# Patient Record
Sex: Female | Born: 1935 | Race: White | Hispanic: No | Marital: Married | Smoking: Never smoker
Health system: Southern US, Community
[De-identification: ages and names within clinical notes are randomized; demographics above are authoritative.]

## PROBLEM LIST (undated history)

## (undated) DIAGNOSIS — R42 Dizziness and giddiness: Secondary | ICD-10-CM

## (undated) DIAGNOSIS — E785 Hyperlipidemia, unspecified: Secondary | ICD-10-CM

## (undated) HISTORY — DX: Hyperlipidemia, unspecified: E78.5

## (undated) HISTORY — DX: Dizziness and giddiness: R42

---

## 2001-04-19 ENCOUNTER — Encounter: Payer: Self-pay | Admitting: Family Medicine

## 2001-04-19 ENCOUNTER — Ambulatory Visit (HOSPITAL_COMMUNITY): Admission: RE | Admit: 2001-04-19 | Discharge: 2001-04-19 | Payer: Self-pay | Admitting: Family Medicine

## 2002-04-23 ENCOUNTER — Emergency Department (HOSPITAL_COMMUNITY): Admission: EM | Admit: 2002-04-23 | Discharge: 2002-04-23 | Payer: Self-pay | Admitting: Emergency Medicine

## 2002-04-23 ENCOUNTER — Encounter: Payer: Self-pay | Admitting: Emergency Medicine

## 2004-01-06 ENCOUNTER — Emergency Department (HOSPITAL_COMMUNITY): Admission: EM | Admit: 2004-01-06 | Discharge: 2004-01-06 | Payer: Self-pay | Admitting: Emergency Medicine

## 2005-12-16 ENCOUNTER — Encounter: Admission: RE | Admit: 2005-12-16 | Discharge: 2005-12-16 | Payer: Self-pay | Admitting: Otolaryngology

## 2010-08-30 ENCOUNTER — Other Ambulatory Visit: Payer: Self-pay | Admitting: Family Medicine

## 2010-08-30 DIAGNOSIS — Z1231 Encounter for screening mammogram for malignant neoplasm of breast: Secondary | ICD-10-CM

## 2010-09-07 ENCOUNTER — Ambulatory Visit: Payer: Self-pay

## 2010-09-30 ENCOUNTER — Ambulatory Visit
Admission: RE | Admit: 2010-09-30 | Discharge: 2010-09-30 | Disposition: A | Discharge status: Home / Self Care | Payer: Medicare Other | Source: Ambulatory Visit | Attending: Family Medicine | Admitting: Family Medicine

## 2010-09-30 DIAGNOSIS — Z1231 Encounter for screening mammogram for malignant neoplasm of breast: Secondary | ICD-10-CM

## 2011-05-10 DIAGNOSIS — E785 Hyperlipidemia, unspecified: Secondary | ICD-10-CM | POA: Diagnosis not present

## 2011-05-10 DIAGNOSIS — R059 Cough, unspecified: Secondary | ICD-10-CM | POA: Diagnosis not present

## 2011-05-10 DIAGNOSIS — R05 Cough: Secondary | ICD-10-CM | POA: Diagnosis not present

## 2011-05-10 DIAGNOSIS — R21 Rash and other nonspecific skin eruption: Secondary | ICD-10-CM | POA: Diagnosis not present

## 2012-03-06 DIAGNOSIS — L820 Inflamed seborrheic keratosis: Secondary | ICD-10-CM | POA: Diagnosis not present

## 2012-03-06 DIAGNOSIS — D1801 Hemangioma of skin and subcutaneous tissue: Secondary | ICD-10-CM | POA: Diagnosis not present

## 2012-03-06 DIAGNOSIS — L919 Hypertrophic disorder of the skin, unspecified: Secondary | ICD-10-CM | POA: Diagnosis not present

## 2012-03-06 DIAGNOSIS — D485 Neoplasm of uncertain behavior of skin: Secondary | ICD-10-CM | POA: Diagnosis not present

## 2012-03-06 DIAGNOSIS — L821 Other seborrheic keratosis: Secondary | ICD-10-CM | POA: Diagnosis not present

## 2012-03-06 DIAGNOSIS — L909 Atrophic disorder of skin, unspecified: Secondary | ICD-10-CM | POA: Diagnosis not present

## 2012-03-15 DIAGNOSIS — Z23 Encounter for immunization: Secondary | ICD-10-CM | POA: Diagnosis not present

## 2012-04-24 DIAGNOSIS — E7800 Pure hypercholesterolemia, unspecified: Secondary | ICD-10-CM | POA: Diagnosis not present

## 2012-04-24 DIAGNOSIS — K219 Gastro-esophageal reflux disease without esophagitis: Secondary | ICD-10-CM | POA: Diagnosis not present

## 2012-04-24 DIAGNOSIS — E039 Hypothyroidism, unspecified: Secondary | ICD-10-CM | POA: Diagnosis not present

## 2012-06-22 DIAGNOSIS — D1801 Hemangioma of skin and subcutaneous tissue: Secondary | ICD-10-CM | POA: Diagnosis not present

## 2012-06-22 DIAGNOSIS — L820 Inflamed seborrheic keratosis: Secondary | ICD-10-CM | POA: Diagnosis not present

## 2012-08-13 DIAGNOSIS — H04129 Dry eye syndrome of unspecified lacrimal gland: Secondary | ICD-10-CM | POA: Diagnosis not present

## 2012-08-13 DIAGNOSIS — H2640 Unspecified secondary cataract: Secondary | ICD-10-CM | POA: Diagnosis not present

## 2012-08-13 DIAGNOSIS — H43819 Vitreous degeneration, unspecified eye: Secondary | ICD-10-CM | POA: Diagnosis not present

## 2012-08-13 DIAGNOSIS — Z961 Presence of intraocular lens: Secondary | ICD-10-CM | POA: Diagnosis not present

## 2012-10-23 DIAGNOSIS — Z1331 Encounter for screening for depression: Secondary | ICD-10-CM | POA: Diagnosis not present

## 2012-10-23 DIAGNOSIS — K219 Gastro-esophageal reflux disease without esophagitis: Secondary | ICD-10-CM | POA: Diagnosis not present

## 2012-10-23 DIAGNOSIS — E7800 Pure hypercholesterolemia, unspecified: Secondary | ICD-10-CM | POA: Diagnosis not present

## 2012-10-23 DIAGNOSIS — E039 Hypothyroidism, unspecified: Secondary | ICD-10-CM | POA: Diagnosis not present

## 2012-10-23 DIAGNOSIS — Z0000 Encounter for general adult medical examination without abnormal findings: Secondary | ICD-10-CM | POA: Diagnosis not present

## 2012-10-23 DIAGNOSIS — Z1211 Encounter for screening for malignant neoplasm of colon: Secondary | ICD-10-CM | POA: Diagnosis not present

## 2012-10-23 DIAGNOSIS — Z23 Encounter for immunization: Secondary | ICD-10-CM | POA: Diagnosis not present

## 2013-03-05 DIAGNOSIS — L570 Actinic keratosis: Secondary | ICD-10-CM | POA: Diagnosis not present

## 2013-03-05 DIAGNOSIS — L723 Sebaceous cyst: Secondary | ICD-10-CM | POA: Diagnosis not present

## 2013-03-05 DIAGNOSIS — L820 Inflamed seborrheic keratosis: Secondary | ICD-10-CM | POA: Diagnosis not present

## 2013-03-05 DIAGNOSIS — L821 Other seborrheic keratosis: Secondary | ICD-10-CM | POA: Diagnosis not present

## 2013-04-17 DIAGNOSIS — E7800 Pure hypercholesterolemia, unspecified: Secondary | ICD-10-CM | POA: Diagnosis not present

## 2013-04-17 DIAGNOSIS — E039 Hypothyroidism, unspecified: Secondary | ICD-10-CM | POA: Diagnosis not present

## 2013-05-08 DIAGNOSIS — M899 Disorder of bone, unspecified: Secondary | ICD-10-CM | POA: Diagnosis not present

## 2013-05-08 DIAGNOSIS — Z780 Asymptomatic menopausal state: Secondary | ICD-10-CM | POA: Diagnosis not present

## 2013-05-08 DIAGNOSIS — M949 Disorder of cartilage, unspecified: Secondary | ICD-10-CM | POA: Diagnosis not present

## 2013-06-03 DIAGNOSIS — M899 Disorder of bone, unspecified: Secondary | ICD-10-CM | POA: Diagnosis not present

## 2013-06-03 DIAGNOSIS — M949 Disorder of cartilage, unspecified: Secondary | ICD-10-CM | POA: Diagnosis not present

## 2013-08-15 DIAGNOSIS — H6120 Impacted cerumen, unspecified ear: Secondary | ICD-10-CM | POA: Diagnosis not present

## 2013-08-21 DIAGNOSIS — Z961 Presence of intraocular lens: Secondary | ICD-10-CM | POA: Diagnosis not present

## 2013-08-21 DIAGNOSIS — H01009 Unspecified blepharitis unspecified eye, unspecified eyelid: Secondary | ICD-10-CM | POA: Diagnosis not present

## 2013-08-21 DIAGNOSIS — H52209 Unspecified astigmatism, unspecified eye: Secondary | ICD-10-CM | POA: Diagnosis not present

## 2013-08-21 DIAGNOSIS — H2640 Unspecified secondary cataract: Secondary | ICD-10-CM | POA: Diagnosis not present

## 2013-12-27 DIAGNOSIS — L255 Unspecified contact dermatitis due to plants, except food: Secondary | ICD-10-CM | POA: Diagnosis not present

## 2014-03-26 DIAGNOSIS — Z23 Encounter for immunization: Secondary | ICD-10-CM | POA: Diagnosis not present

## 2014-04-23 ENCOUNTER — Ambulatory Visit: Payer: Self-pay | Admitting: Podiatrist

## 2014-04-24 DIAGNOSIS — J209 Acute bronchitis, unspecified: Secondary | ICD-10-CM | POA: Diagnosis not present

## 2014-04-24 DIAGNOSIS — J069 Acute upper respiratory infection, unspecified: Secondary | ICD-10-CM | POA: Diagnosis not present

## 2014-05-16 ENCOUNTER — Ambulatory Visit (INDEPENDENT_AMBULATORY_CARE_PROVIDER_SITE_OTHER): Payer: Medicare Other | Admitting: Podiatrist

## 2014-05-16 ENCOUNTER — Other Ambulatory Visit: Payer: Self-pay | Admitting: Podiatrist

## 2014-05-16 ENCOUNTER — Ambulatory Visit (INDEPENDENT_AMBULATORY_CARE_PROVIDER_SITE_OTHER): Payer: Medicare Other

## 2014-05-16 ENCOUNTER — Encounter: Payer: Self-pay | Admitting: Podiatrist

## 2014-05-16 VITALS — BP 141/76 | HR 67 | Resp 16

## 2014-05-16 DIAGNOSIS — M79672 Pain in left foot: Secondary | ICD-10-CM | POA: Diagnosis not present

## 2014-05-16 DIAGNOSIS — D3610 Benign neoplasm of peripheral nerves and autonomic nervous system, unspecified: Secondary | ICD-10-CM

## 2014-05-16 DIAGNOSIS — M8430XA Stress fracture, unspecified site, initial encounter for fracture: Secondary | ICD-10-CM

## 2014-05-16 DIAGNOSIS — M779 Enthesopathy, unspecified: Secondary | ICD-10-CM | POA: Diagnosis not present

## 2014-05-16 NOTE — Progress Notes (Signed)
   Subjective:    Patient ID: Kristen Lin, female    DOB: [birth year], 81 y.o.   MRN: [id number]  HPI Comments: "I have pain in the ball of my foot"  Patient c/o stinging, burning, aching plantar forefoot left for about 1 year. Walking a lot makes worse. Tried different shoes. Usually sitting down for about an hour will make feel better.     Review of Systems  All other systems reviewed and are negative.      Objective:   Physical Exam Vascular examination reveals palpable pedal pulses at 2/4 DP and PT bilateral. Neurological sensation is intact epicritic and protectively bilateral. Neuroma-type symptomatology elicited second interspace left. Subjective complaints of neuroma-type symptomatology is also noted. Musculoskeletal examination reveals pain second interspace as well as pain along the third and fourth metatarsals left. X-rays are suspicious for a stress fracture at the head of the fourth metatarsal left foot. She also does have some swelling noted. Dermatological examination reveals intact and supple moist skin. Mild swelling is also noted left foot.    Assessment & Plan:  Stress fracture left fourth metatarsal versus neuroma left foot  Plan: Because of the possibility of a stress fracture I recommended a surgical shoe to be worn for 2-3 weeks. I will see her back in 2-3 weeks and we'll discuss if there is any benefit at this time. If there is not we'll consider a steroid injection at that time. However at this time I would like to avoid any steroid into the area of the suspected fracture. The patient demonstrates an understanding of this conversation and I'll see her back for a follow-up appointment.

## 2014-05-26 DIAGNOSIS — K219 Gastro-esophageal reflux disease without esophagitis: Secondary | ICD-10-CM | POA: Diagnosis not present

## 2014-05-26 DIAGNOSIS — E039 Hypothyroidism, unspecified: Secondary | ICD-10-CM | POA: Diagnosis not present

## 2014-05-26 DIAGNOSIS — Z23 Encounter for immunization: Secondary | ICD-10-CM | POA: Diagnosis not present

## 2014-05-26 DIAGNOSIS — E780 Pure hypercholesterolemia: Secondary | ICD-10-CM | POA: Diagnosis not present

## 2014-06-06 ENCOUNTER — Ambulatory Visit: Payer: Medicare Other | Admitting: Podiatrist

## 2014-06-11 ENCOUNTER — Other Ambulatory Visit: Payer: Self-pay | Admitting: Podiatrist

## 2014-06-11 ENCOUNTER — Ambulatory Visit (INDEPENDENT_AMBULATORY_CARE_PROVIDER_SITE_OTHER): Payer: Medicare Other | Admitting: Podiatrist

## 2014-06-11 ENCOUNTER — Ambulatory Visit (INDEPENDENT_AMBULATORY_CARE_PROVIDER_SITE_OTHER): Payer: Medicare Other

## 2014-06-11 ENCOUNTER — Encounter: Payer: Self-pay | Admitting: Podiatrist

## 2014-06-11 VITALS — BP 129/71 | HR 81 | Resp 16

## 2014-06-11 DIAGNOSIS — S92302D Fracture of unspecified metatarsal bone(s), left foot, subsequent encounter for fracture with routine healing: Secondary | ICD-10-CM

## 2014-06-11 DIAGNOSIS — G5762 Lesion of plantar nerve, left lower limb: Secondary | ICD-10-CM

## 2014-06-11 DIAGNOSIS — G5782 Other specified mononeuropathies of left lower limb: Secondary | ICD-10-CM

## 2014-06-11 DIAGNOSIS — M779 Enthesopathy, unspecified: Secondary | ICD-10-CM

## 2014-06-25 NOTE — Progress Notes (Signed)
Chief Complaint  Patient presents with  . Foot Pain    Follow up stress fracture 4th met vs. neuroma left foot   "Its okay I guess"     HPI: Patient is 81 y.o. female who presents today for continued pain left foot. She has been wearing the post op shoe and has noticed little improvement.    Allergies  Allergen Reactions  . Penicillins     Physical Exam  Neurovascular status unchanged.  Pain on palpation second and third interspace is still present. No sign of fracture on xrays noted. Palpable pain with pressure and mild palpable click noted third interspace left foot.  Probable neuroma suspected.      Assessment: neuroma left foot  Plan: a steroid injection was carried out under sterile technique third interspace left and patient given instructions for going back into her regular sneaker.  She will call if the pain is not improved and will consider alcohol sclerosing injections.

## 2014-07-23 DIAGNOSIS — H9313 Tinnitus, bilateral: Secondary | ICD-10-CM | POA: Diagnosis not present

## 2014-07-23 DIAGNOSIS — H9190 Unspecified hearing loss, unspecified ear: Secondary | ICD-10-CM | POA: Diagnosis not present

## 2014-07-23 DIAGNOSIS — R42 Dizziness and giddiness: Secondary | ICD-10-CM | POA: Diagnosis not present

## 2014-08-21 DIAGNOSIS — H8110 Benign paroxysmal vertigo, unspecified ear: Secondary | ICD-10-CM | POA: Diagnosis not present

## 2014-08-21 DIAGNOSIS — J310 Chronic rhinitis: Secondary | ICD-10-CM | POA: Diagnosis not present

## 2014-08-21 DIAGNOSIS — R42 Dizziness and giddiness: Secondary | ICD-10-CM | POA: Diagnosis not present

## 2014-08-25 DIAGNOSIS — H04123 Dry eye syndrome of bilateral lacrimal glands: Secondary | ICD-10-CM | POA: Diagnosis not present

## 2014-08-25 DIAGNOSIS — H01001 Unspecified blepharitis right upper eyelid: Secondary | ICD-10-CM | POA: Diagnosis not present

## 2014-08-25 DIAGNOSIS — H16103 Unspecified superficial keratitis, bilateral: Secondary | ICD-10-CM | POA: Diagnosis not present

## 2014-08-25 DIAGNOSIS — H26492 Other secondary cataract, left eye: Secondary | ICD-10-CM | POA: Diagnosis not present

## 2014-08-26 DIAGNOSIS — H9113 Presbycusis, bilateral: Secondary | ICD-10-CM | POA: Diagnosis not present

## 2014-08-26 DIAGNOSIS — E039 Hypothyroidism, unspecified: Secondary | ICD-10-CM | POA: Diagnosis not present

## 2014-08-26 DIAGNOSIS — H903 Sensorineural hearing loss, bilateral: Secondary | ICD-10-CM | POA: Diagnosis not present

## 2014-08-26 DIAGNOSIS — R946 Abnormal results of thyroid function studies: Secondary | ICD-10-CM | POA: Diagnosis not present

## 2014-08-26 DIAGNOSIS — H8142 Vertigo of central origin, left ear: Secondary | ICD-10-CM | POA: Diagnosis not present

## 2014-08-26 DIAGNOSIS — H9313 Tinnitus, bilateral: Secondary | ICD-10-CM | POA: Diagnosis not present

## 2014-08-26 DIAGNOSIS — R42 Dizziness and giddiness: Secondary | ICD-10-CM | POA: Diagnosis not present

## 2014-09-01 ENCOUNTER — Encounter: Payer: Self-pay | Admitting: Neurology

## 2014-09-01 ENCOUNTER — Ambulatory Visit (INDEPENDENT_AMBULATORY_CARE_PROVIDER_SITE_OTHER): Payer: Medicare Other | Admitting: Neurology

## 2014-09-01 VITALS — BP 130/62 | HR 68 | Resp 14 | Ht <= 58 in | Wt 142.0 lb

## 2014-09-01 DIAGNOSIS — H9193 Unspecified hearing loss, bilateral: Secondary | ICD-10-CM | POA: Diagnosis not present

## 2014-09-01 DIAGNOSIS — R42 Dizziness and giddiness: Secondary | ICD-10-CM

## 2014-09-01 DIAGNOSIS — H9313 Tinnitus, bilateral: Secondary | ICD-10-CM

## 2014-09-01 NOTE — Progress Notes (Signed)
Subjective:    Patient ID: Kristen Lin is a 81 y.o. female.  HPI     Star Age, MD, PhD [facility] [address], [address] [place], [place] [address]  Dear Dr. Constance Holster,   I saw your patient, Kristen Lin, upon your kind request in my neurologic clinic today for initial consultation of her vertigo. The patient is unaccompanied today. As you know, Kristen Lin is an 26 -year-old right-handed woman with an underlying medical history of hyperlipidemia, reflux disease, hypothyroidism and overweight state, who has suffered from recurrent vertiginous spells as well as tinnitus for the past several years. Symptoms have been more progressive in the last 4-5 months. She is able to provide most of her history herself but her husband provides some details. He is asking whether stress can exacerbate her symptoms. Of note, her youngest son, age 3 moved b with them in with them some 6 months ago. Unfortunately, he has mental health problems and is not able to keep a job. He was in a homeless shelter but was not able to say if there any longer. This has created a lot of friction between the patient's husband and her son. She tries to keep the peace at home. They were hoping to sell their home and move into a retirement community but this has been put on hold because they don't know where her son will go. She has had intermittent vertigo almost on a daily basis. She feels that the room is spinning. Sometimes her ringing in her ears is the worst symptom. This sounds like a loud screeching noise and causes her to have a funny feeling in her head. She denies any headaches, one-sided weakness, numbness, slurring of speech or droopy face. She does not have a history of migraine in the past. I reviewed your office notes from 08/21/14 and 08/26/2014, which you kindly included. She also had a hearing test done which showed bilateral sensorineural hearing loss, on 08/26/2014. She had a  Dix-Hallpike examination on 08/21/2014 which was reported as negative. She had  She has been married 23 years, but has known her current husband since teenage years, and they both were widowed. She has 4 children and he has 2 daughters.  In the past, for indications of dizziness, she had a brain MRI w/wo on 12/16/05, which I reviewed: No specific cause of the patient's described symptoms is identified. There is ordinary brain atrophy and chronic small vessel change without evidence of an acute stroke or any other reversible process. Specifically, no lesion is seen in the region of either temporal bone or CP angle.  In addition, I personally reviewed the images through the PACS system and also shared some images on the computer with them to explain findings, in particular white matter changes of her brain.  She has used meclizine intermittently which reasonable success in the past but not recently.  Her Past Medical History Is Significant For: Past Medical History  Diagnosis Date  . Hyperlipemia   . Vertigo     Her Past Surgical History Is Significant For: No past surgical history on file.  Her Family History Is Significant For: No family history on file.  Her Social History Is Significant For: History   Social History  . Marital Status: Married    Spouse Name: N/A  . Number of Children: 4  . Years of Education: 10th grade   Occupational History  . Retired    Social History Main Topics  .  Smoking status: Never Smoker   . Smokeless tobacco: Not on file  . Alcohol Use: No  . Drug Use: No  . Sexual Activity: Not on file   Other Topics Concern  . None   Social History Narrative   1 cup of coffee a day, 2-3 diet drinks a day     Her Allergies Are:  Allergies  Allergen Reactions  . Penicillins   :   Her Current Medications Are:   See list.   Review of Systems:  Out of a complete 14 point review of systems, all are reviewed and negative with the exception of these  symptoms as listed below:   Review of Systems  Constitutional: Positive for fatigue.  HENT: Positive for tinnitus.        Spinning sensation   Neurological:       Memory loss, confusion, dizziness, Passing out, "Screaming in head"  Psychiatric/Behavioral:       Decreased energy, Disinterest in activities     Objective:  Neurologic Exam  Physical Exam Physical Examination:   Filed Vitals:   09/01/14 1317  BP: 130/62  Pulse: 68  Resp: 14   General Examination: The patient is a very pleasant 81 y.o. female in no acute distress. She appears well-developed and well-nourished and well groomed.  She reports mild nonspecific lightheadedness upon standing but no vertiginous symptoms.  HEENT: Normocephalic, atraumatic, pupils are equal, round and reactive to light and accommodation. Funduscopic exam is normal with sharp disc margins noted. Extraocular tracking is good without limitation to gaze excursion or nystagmus noted. Normal smooth pursuit is noted. Hearing is grossly intact. Tympanic membranes are clear bilaterally. Face is symmetric with normal facial animation and normal facial sensation. Speech is clear with no dysarthria noted. She denies any tinnitus currently. She does not have any nystagmus or vertiginous symptoms upon sudden changes of her head position. There is no hypophonia. There is no lip, neck/head, jaw or voice tremor. Neck is supple with full range of passive and active motion. There are no carotid bruits on auscultation. Oropharynx exam reveals: moderate mouth dryness, adequate dental hygiene and mild airway crowding. Mallampati is class II. Tongue protrudes centrally and palate elevates symmetrically.   Chest: Clear to auscultation without wheezing, rhonchi or crackles noted.  Heart: S1+S2+0, regular and normal without murmurs, rubs or gallops noted.   Abdomen: Soft, non-tender and non-distended with normal bowel sounds appreciated on auscultation.  Extremities:  There is no pitting edema in the distal lower extremities bilaterally. Pedal pulses are intact.  Skin: Warm and dry without trophic changes noted. There are no varicose veins.  Musculoskeletal: exam reveals no obvious joint deformities, tenderness or joint swelling or erythema.   Neurologically:  Mental status: The patient is awake, alert and oriented in all 4 spheres. Her immediate and remote memory, attention, language skills and fund of knowledge are appropriate. There is no evidence of aphasia, agnosia, apraxia or anomia. Speech is clear with normal prosody and enunciation. Thought process is linear. Mood is normal and affect is normal.  Cranial nerves II - XII are as described above under HEENT exam. In addition: shoulder shrug is normal with equal shoulder height noted. Motor exam: Normal bulk, strength and tone is noted. There is no drift, tremor or rebound. Romberg is negative. Reflexes are 2+ throughout. Babinski: Toes are flexor bilaterally. Fine motor skills and coordination: intact with normal finger taps, normal hand movements, normal rapid alternating patting, normal foot taps and normal foot agility.  Cerebellar  testing: No dysmetria or intention tremor on finger to nose testing. Heel to shin is unremarkable bilaterally. There is no truncal or gait ataxia.  Sensory exam: intact to light touch, pinprick, vibration, temperature sense proprioception in the upper and lower extremities.  Gait, station and balance: She stands with mild difficulty. No veering to one side is noted. No leaning to one side is noted. Posture is age-appropriate and stance is narrow based. Gait shows mild insecurity when walking. She has difficulty with tandem walk. She has no trouble turning but overall walks a little cautiously.  Assessment and Plan:   In summary, Kristen Lin is a very pleasant 81 y.o.-year old female with an underlying medical history of hyperlipidemia, reflux disease, hypothyroidism and  overweight state, who has suffered from recurrent vertiginous spells as well as tinnitus for the past several years. Symptoms have been more frequent and progressive in the last 4-5 months. Looking back at her history she may have had dizziness and vertigo for about 8-9 years. She had a brain MRI with and without contrast which we reviewed today. Thankfully her neurological exam is nonfocal at this time. Nevertheless, I would suggest that she have a repeat brain MRI with and without contrast for comparison. I talked to her and her husband at length about vertigo and that there is usually no specific treatment for this. She is advised to start using a cane for gait safety and security when standing. Thankfully she has not fallen. She is advised to stay better hydrated and change positions slowly. We will call her with her MRI results. She can continue using meclizine as needed. If symptoms do not improve with time I would recommend that she undergo vestibular rehabilitation under physical therapy. I would happy to make a referral. I will see her back routinely in about 3 months, sooner if the need arises.   Thank you very much for allowing me to participate in the care of this nice patient. If I can be of any further assistance to you please do not hesitate to call me at [phone].  Sincerely,   Star Age, MD, PhD

## 2014-09-01 NOTE — Patient Instructions (Addendum)
Please remember, that vertigo can recur without warning. It can last hours or days. Please change positions slowly and always stay well-hydrated. I think you do need to drink more water. Physical therapy with particular attention to vestibular rehabilitation can be very helpful. While there is no specific medication that helps with vertigo, some people get relief with as needed use of meclizine. Certain medications can exacerbate vertigo.   Please consider using a cane for safety.   We will do a brain MRI and compare with findings from 2007.   We will consider sending you to physical therapy.

## 2014-09-12 ENCOUNTER — Ambulatory Visit
Admission: RE | Admit: 2014-09-12 | Discharge: 2014-09-12 | Disposition: A | Discharge status: Home / Self Care | Payer: Medicare Other | Source: Ambulatory Visit | Attending: Neurology | Admitting: Neurology

## 2014-09-12 DIAGNOSIS — H9313 Tinnitus, bilateral: Secondary | ICD-10-CM

## 2014-09-12 DIAGNOSIS — R42 Dizziness and giddiness: Secondary | ICD-10-CM

## 2014-09-12 DIAGNOSIS — H9193 Unspecified hearing loss, bilateral: Secondary | ICD-10-CM | POA: Diagnosis not present

## 2014-09-12 MED ORDER — GADOBENATE DIMEGLUMINE 529 MG/ML IV SOLN: 12.0000 mL | Freq: Once | INTRAVENOUS | Status: AC | PRN

## 2014-09-16 DIAGNOSIS — M47896 Other spondylosis, lumbar region: Secondary | ICD-10-CM | POA: Diagnosis not present

## 2014-09-16 DIAGNOSIS — M9903 Segmental and somatic dysfunction of lumbar region: Secondary | ICD-10-CM | POA: Diagnosis not present

## 2014-09-17 DIAGNOSIS — M9901 Segmental and somatic dysfunction of cervical region: Secondary | ICD-10-CM | POA: Diagnosis not present

## 2014-09-17 DIAGNOSIS — M9903 Segmental and somatic dysfunction of lumbar region: Secondary | ICD-10-CM | POA: Diagnosis not present

## 2014-09-17 DIAGNOSIS — M47812 Spondylosis without myelopathy or radiculopathy, cervical region: Secondary | ICD-10-CM | POA: Diagnosis not present

## 2014-09-17 DIAGNOSIS — M47896 Other spondylosis, lumbar region: Secondary | ICD-10-CM | POA: Diagnosis not present

## 2014-09-18 DIAGNOSIS — M9901 Segmental and somatic dysfunction of cervical region: Secondary | ICD-10-CM | POA: Diagnosis not present

## 2014-09-18 DIAGNOSIS — M47896 Other spondylosis, lumbar region: Secondary | ICD-10-CM | POA: Diagnosis not present

## 2014-09-18 DIAGNOSIS — M9903 Segmental and somatic dysfunction of lumbar region: Secondary | ICD-10-CM | POA: Diagnosis not present

## 2014-09-18 DIAGNOSIS — M47812 Spondylosis without myelopathy or radiculopathy, cervical region: Secondary | ICD-10-CM | POA: Diagnosis not present

## 2014-09-22 ENCOUNTER — Telehealth: Payer: Self-pay | Admitting: Neurology

## 2014-09-22 DIAGNOSIS — M47896 Other spondylosis, lumbar region: Secondary | ICD-10-CM | POA: Diagnosis not present

## 2014-09-22 DIAGNOSIS — M47812 Spondylosis without myelopathy or radiculopathy, cervical region: Secondary | ICD-10-CM | POA: Diagnosis not present

## 2014-09-22 DIAGNOSIS — M9903 Segmental and somatic dysfunction of lumbar region: Secondary | ICD-10-CM | POA: Diagnosis not present

## 2014-09-22 DIAGNOSIS — M9901 Segmental and somatic dysfunction of cervical region: Secondary | ICD-10-CM | POA: Diagnosis not present

## 2014-09-22 NOTE — Telephone Encounter (Signed)
Please call patient regarding the recent brain MRI: The brain scan showed a normal structure of the brain and no significant volume loss which we call atrophy. There were changes in the deeper structures of the brain, which we call white matter changes or microvascular changes. These were reported as mild to moderate in Her case. These are tiny white spots, that occur with time and are seen in a variety of conditions, including with normal aging, chronic hypertension, chronic headaches, especially migraine HAs, chronic diabetes, chronic hyperlipidemia. These are not strokes and no mass or lesion or contrast enhancement was seen which is reassuring. Again, there were no acute findings, such as a stroke, or mass or blood products. No further action is required on this test at this time, other than re-enforcing the importance of good blood pressure control, good cholesterol control, good blood sugar control, and weight management.  Of note, there was not much change reported when compared to an MRI brain from 12/16/2005, which overall is a reassuring finding. In addition, the ear canals appeared to be normal. Please remind patient to keep any upcoming appointments or tests and to call us with any interim questions, concerns, problems or updates. Thanks,  Star Age, MD, PhD

## 2014-09-22 NOTE — Telephone Encounter (Signed)
Patient's husband is calling to get the results of the MRI in regard to loud noise in wife's ears. Please call.

## 2014-09-22 NOTE — Progress Notes (Signed)
Quick Note:  Please call patient regarding the recent brain MRI: The brain scan showed a normal structure of the brain and no significant volume loss which we call atrophy. There were changes in the deeper structures of the brain, which we call white matter changes or microvascular changes. These were reported as mild to moderate in Her case. These are tiny white spots, that occur with time and are seen in a variety of conditions, including with normal aging, chronic hypertension, chronic headaches, especially migraine HAs, chronic diabetes, chronic hyperlipidemia. These are not strokes and no mass or lesion or contrast enhancement was seen which is reassuring. Again, there were no acute findings, such as a stroke, or mass or blood products. No further action is required on this test at this time, other than re-enforcing the importance of good blood pressure control, good cholesterol control, good blood sugar control, and weight management.  Of note, there was not much change reported when compared to an MRI brain from 12/16/2005, which overall is a reassuring finding. In addition, the ear canals appeared to be normal. Please remind patient to keep any upcoming appointments or tests and to call us with any interim questions, concerns, problems or updates. Thanks,  Star Age, MD, PhD    ______

## 2014-09-23 DIAGNOSIS — M9901 Segmental and somatic dysfunction of cervical region: Secondary | ICD-10-CM | POA: Diagnosis not present

## 2014-09-23 DIAGNOSIS — M9903 Segmental and somatic dysfunction of lumbar region: Secondary | ICD-10-CM | POA: Diagnosis not present

## 2014-09-23 DIAGNOSIS — M47812 Spondylosis without myelopathy or radiculopathy, cervical region: Secondary | ICD-10-CM | POA: Diagnosis not present

## 2014-09-23 DIAGNOSIS — M47896 Other spondylosis, lumbar region: Secondary | ICD-10-CM | POA: Diagnosis not present

## 2014-09-23 NOTE — Telephone Encounter (Signed)
I spoke to patient. She is aware of results and voices understanding. Patient requested to cancel appt in August and states that she will call us back if needed.

## 2014-09-24 DIAGNOSIS — M47896 Other spondylosis, lumbar region: Secondary | ICD-10-CM | POA: Diagnosis not present

## 2014-09-24 DIAGNOSIS — M9903 Segmental and somatic dysfunction of lumbar region: Secondary | ICD-10-CM | POA: Diagnosis not present

## 2014-09-24 DIAGNOSIS — M9901 Segmental and somatic dysfunction of cervical region: Secondary | ICD-10-CM | POA: Diagnosis not present

## 2014-09-24 DIAGNOSIS — M47812 Spondylosis without myelopathy or radiculopathy, cervical region: Secondary | ICD-10-CM | POA: Diagnosis not present

## 2014-09-25 DIAGNOSIS — H26492 Other secondary cataract, left eye: Secondary | ICD-10-CM | POA: Diagnosis not present

## 2014-09-25 DIAGNOSIS — H2640 Unspecified secondary cataract: Secondary | ICD-10-CM | POA: Diagnosis not present

## 2014-09-30 DIAGNOSIS — M47896 Other spondylosis, lumbar region: Secondary | ICD-10-CM | POA: Diagnosis not present

## 2014-09-30 DIAGNOSIS — M9901 Segmental and somatic dysfunction of cervical region: Secondary | ICD-10-CM | POA: Diagnosis not present

## 2014-09-30 DIAGNOSIS — M9903 Segmental and somatic dysfunction of lumbar region: Secondary | ICD-10-CM | POA: Diagnosis not present

## 2014-09-30 DIAGNOSIS — M47812 Spondylosis without myelopathy or radiculopathy, cervical region: Secondary | ICD-10-CM | POA: Diagnosis not present

## 2014-10-01 DIAGNOSIS — M47812 Spondylosis without myelopathy or radiculopathy, cervical region: Secondary | ICD-10-CM | POA: Diagnosis not present

## 2014-10-01 DIAGNOSIS — M47896 Other spondylosis, lumbar region: Secondary | ICD-10-CM | POA: Diagnosis not present

## 2014-10-01 DIAGNOSIS — M9903 Segmental and somatic dysfunction of lumbar region: Secondary | ICD-10-CM | POA: Diagnosis not present

## 2014-10-01 DIAGNOSIS — M9901 Segmental and somatic dysfunction of cervical region: Secondary | ICD-10-CM | POA: Diagnosis not present

## 2014-10-06 DIAGNOSIS — M47812 Spondylosis without myelopathy or radiculopathy, cervical region: Secondary | ICD-10-CM | POA: Diagnosis not present

## 2014-10-06 DIAGNOSIS — M9901 Segmental and somatic dysfunction of cervical region: Secondary | ICD-10-CM | POA: Diagnosis not present

## 2014-10-06 DIAGNOSIS — M47896 Other spondylosis, lumbar region: Secondary | ICD-10-CM | POA: Diagnosis not present

## 2014-10-06 DIAGNOSIS — M9903 Segmental and somatic dysfunction of lumbar region: Secondary | ICD-10-CM | POA: Diagnosis not present

## 2014-10-08 DIAGNOSIS — M47812 Spondylosis without myelopathy or radiculopathy, cervical region: Secondary | ICD-10-CM | POA: Diagnosis not present

## 2014-10-08 DIAGNOSIS — M9903 Segmental and somatic dysfunction of lumbar region: Secondary | ICD-10-CM | POA: Diagnosis not present

## 2014-10-08 DIAGNOSIS — M47896 Other spondylosis, lumbar region: Secondary | ICD-10-CM | POA: Diagnosis not present

## 2014-10-08 DIAGNOSIS — M9901 Segmental and somatic dysfunction of cervical region: Secondary | ICD-10-CM | POA: Diagnosis not present

## 2014-10-13 DIAGNOSIS — M47896 Other spondylosis, lumbar region: Secondary | ICD-10-CM | POA: Diagnosis not present

## 2014-10-13 DIAGNOSIS — M9903 Segmental and somatic dysfunction of lumbar region: Secondary | ICD-10-CM | POA: Diagnosis not present

## 2014-10-13 DIAGNOSIS — M9901 Segmental and somatic dysfunction of cervical region: Secondary | ICD-10-CM | POA: Diagnosis not present

## 2014-10-13 DIAGNOSIS — M47812 Spondylosis without myelopathy or radiculopathy, cervical region: Secondary | ICD-10-CM | POA: Diagnosis not present

## 2014-10-16 DIAGNOSIS — M47896 Other spondylosis, lumbar region: Secondary | ICD-10-CM | POA: Diagnosis not present

## 2014-10-16 DIAGNOSIS — M9903 Segmental and somatic dysfunction of lumbar region: Secondary | ICD-10-CM | POA: Diagnosis not present

## 2014-10-16 DIAGNOSIS — M47812 Spondylosis without myelopathy or radiculopathy, cervical region: Secondary | ICD-10-CM | POA: Diagnosis not present

## 2014-10-16 DIAGNOSIS — M9901 Segmental and somatic dysfunction of cervical region: Secondary | ICD-10-CM | POA: Diagnosis not present

## 2014-10-20 DIAGNOSIS — M47812 Spondylosis without myelopathy or radiculopathy, cervical region: Secondary | ICD-10-CM | POA: Diagnosis not present

## 2014-10-20 DIAGNOSIS — M47896 Other spondylosis, lumbar region: Secondary | ICD-10-CM | POA: Diagnosis not present

## 2014-10-20 DIAGNOSIS — M9903 Segmental and somatic dysfunction of lumbar region: Secondary | ICD-10-CM | POA: Diagnosis not present

## 2014-10-20 DIAGNOSIS — M9901 Segmental and somatic dysfunction of cervical region: Secondary | ICD-10-CM | POA: Diagnosis not present

## 2014-10-22 DIAGNOSIS — M9901 Segmental and somatic dysfunction of cervical region: Secondary | ICD-10-CM | POA: Diagnosis not present

## 2014-10-22 DIAGNOSIS — M47812 Spondylosis without myelopathy or radiculopathy, cervical region: Secondary | ICD-10-CM | POA: Diagnosis not present

## 2014-10-22 DIAGNOSIS — M47896 Other spondylosis, lumbar region: Secondary | ICD-10-CM | POA: Diagnosis not present

## 2014-10-22 DIAGNOSIS — M9903 Segmental and somatic dysfunction of lumbar region: Secondary | ICD-10-CM | POA: Diagnosis not present

## 2014-10-28 DIAGNOSIS — R946 Abnormal results of thyroid function studies: Secondary | ICD-10-CM | POA: Diagnosis not present

## 2014-12-02 ENCOUNTER — Ambulatory Visit: Payer: Medicare Other | Admitting: Neurology

## 2015-01-26 DIAGNOSIS — Z0000 Encounter for general adult medical examination without abnormal findings: Secondary | ICD-10-CM | POA: Diagnosis not present

## 2015-01-26 DIAGNOSIS — Z1389 Encounter for screening for other disorder: Secondary | ICD-10-CM | POA: Diagnosis not present

## 2015-01-26 DIAGNOSIS — E039 Hypothyroidism, unspecified: Secondary | ICD-10-CM | POA: Diagnosis not present

## 2015-01-26 DIAGNOSIS — K219 Gastro-esophageal reflux disease without esophagitis: Secondary | ICD-10-CM | POA: Diagnosis not present

## 2015-01-26 DIAGNOSIS — Z1211 Encounter for screening for malignant neoplasm of colon: Secondary | ICD-10-CM | POA: Diagnosis not present

## 2015-01-26 DIAGNOSIS — Z23 Encounter for immunization: Secondary | ICD-10-CM | POA: Diagnosis not present

## 2015-01-26 DIAGNOSIS — E780 Pure hypercholesterolemia: Secondary | ICD-10-CM | POA: Diagnosis not present

## 2015-02-10 DIAGNOSIS — Z1211 Encounter for screening for malignant neoplasm of colon: Secondary | ICD-10-CM | POA: Diagnosis not present

## 2015-04-13 ENCOUNTER — Emergency Department (HOSPITAL_COMMUNITY)
Admission: EM | Admit: 2015-04-13 | Discharge: 2015-04-13 | Discharge status: Absent without leave | Payer: Self-pay | Source: Home / Self Care

## 2015-06-11 ENCOUNTER — Other Ambulatory Visit: Payer: Self-pay

## 2015-06-11 DIAGNOSIS — Z1231 Encounter for screening mammogram for malignant neoplasm of breast: Secondary | ICD-10-CM

## 2015-07-02 ENCOUNTER — Ambulatory Visit: Payer: Medicare Other

## 2015-07-21 ENCOUNTER — Ambulatory Visit
Admission: RE | Admit: 2015-07-21 | Discharge: 2015-07-21 | Disposition: A | Discharge status: Home / Self Care | Payer: Medicare Other | Source: Ambulatory Visit

## 2015-07-21 DIAGNOSIS — Z1231 Encounter for screening mammogram for malignant neoplasm of breast: Secondary | ICD-10-CM | POA: Diagnosis not present

## 2015-07-27 DIAGNOSIS — E039 Hypothyroidism, unspecified: Secondary | ICD-10-CM | POA: Diagnosis not present

## 2015-07-27 DIAGNOSIS — K219 Gastro-esophageal reflux disease without esophagitis: Secondary | ICD-10-CM | POA: Diagnosis not present

## 2015-07-27 DIAGNOSIS — M8580 Other specified disorders of bone density and structure, unspecified site: Secondary | ICD-10-CM | POA: Diagnosis not present

## 2015-07-27 DIAGNOSIS — E7800 Pure hypercholesterolemia, unspecified: Secondary | ICD-10-CM | POA: Diagnosis not present

## 2015-10-22 DIAGNOSIS — H04123 Dry eye syndrome of bilateral lacrimal glands: Secondary | ICD-10-CM | POA: Diagnosis not present

## 2015-10-22 DIAGNOSIS — H5211 Myopia, right eye: Secondary | ICD-10-CM | POA: Diagnosis not present

## 2015-10-22 DIAGNOSIS — H16103 Unspecified superficial keratitis, bilateral: Secondary | ICD-10-CM | POA: Diagnosis not present

## 2015-10-22 DIAGNOSIS — Z961 Presence of intraocular lens: Secondary | ICD-10-CM | POA: Diagnosis not present

## 2016-02-10 DIAGNOSIS — B372 Candidiasis of skin and nail: Secondary | ICD-10-CM | POA: Diagnosis not present

## 2016-02-10 DIAGNOSIS — Z23 Encounter for immunization: Secondary | ICD-10-CM | POA: Diagnosis not present

## 2016-05-09 DIAGNOSIS — E039 Hypothyroidism, unspecified: Secondary | ICD-10-CM | POA: Diagnosis not present

## 2016-05-09 DIAGNOSIS — M79672 Pain in left foot: Secondary | ICD-10-CM | POA: Diagnosis not present

## 2016-05-09 DIAGNOSIS — M8580 Other specified disorders of bone density and structure, unspecified site: Secondary | ICD-10-CM | POA: Diagnosis not present

## 2016-05-09 DIAGNOSIS — D72820 Lymphocytosis (symptomatic): Secondary | ICD-10-CM | POA: Diagnosis not present

## 2016-05-09 DIAGNOSIS — E7800 Pure hypercholesterolemia, unspecified: Secondary | ICD-10-CM | POA: Diagnosis not present

## 2016-05-09 DIAGNOSIS — K219 Gastro-esophageal reflux disease without esophagitis: Secondary | ICD-10-CM | POA: Diagnosis not present

## 2016-05-09 DIAGNOSIS — Z1389 Encounter for screening for other disorder: Secondary | ICD-10-CM | POA: Diagnosis not present

## 2016-05-09 DIAGNOSIS — Z0001 Encounter for general adult medical examination with abnormal findings: Secondary | ICD-10-CM | POA: Diagnosis not present

## 2016-05-09 DIAGNOSIS — L84 Corns and callosities: Secondary | ICD-10-CM | POA: Diagnosis not present

## 2016-05-16 ENCOUNTER — Encounter: Payer: Self-pay | Admitting: Podiatry

## 2016-05-16 ENCOUNTER — Ambulatory Visit (INDEPENDENT_AMBULATORY_CARE_PROVIDER_SITE_OTHER): Payer: Medicare HMO | Admitting: Podiatry

## 2016-05-16 ENCOUNTER — Ambulatory Visit (INDEPENDENT_AMBULATORY_CARE_PROVIDER_SITE_OTHER): Payer: Medicare HMO

## 2016-05-16 VITALS — BP 139/75 | HR 66 | Resp 16

## 2016-05-16 DIAGNOSIS — M79672 Pain in left foot: Secondary | ICD-10-CM | POA: Diagnosis not present

## 2016-05-16 DIAGNOSIS — G5782 Other specified mononeuropathies of left lower limb: Secondary | ICD-10-CM

## 2016-05-16 DIAGNOSIS — M779 Enthesopathy, unspecified: Secondary | ICD-10-CM | POA: Diagnosis not present

## 2016-05-22 NOTE — Progress Notes (Signed)
Subjective:     Patient ID: Kristen Lin, female   DOB: 1930/06/02, 83 y.o.   MRN: [id number]  HPI patient states that she still having pain in her foot but that the shooting pain is not as bad   Review of Systems     Objective:   Physical Exam Neurovascular status intact with continued positive Biagio Borg sign with shooting radiating discomfort into the adjacent digits    Assessment:     Neuroma symptomatology left still present    Plan:     Went ahead today and I did a careful sterile prep of the area injected directly into the nerve root with a dehydrated alcohol Marcaine solution which was tolerated well and reappoint again in 2 weeks

## 2016-06-01 ENCOUNTER — Ambulatory Visit (INDEPENDENT_AMBULATORY_CARE_PROVIDER_SITE_OTHER): Payer: Medicare HMO | Admitting: Podiatry

## 2016-06-01 ENCOUNTER — Encounter: Payer: Self-pay | Admitting: Podiatry

## 2016-06-01 DIAGNOSIS — G5762 Lesion of plantar nerve, left lower limb: Secondary | ICD-10-CM

## 2016-06-01 DIAGNOSIS — G5782 Other specified mononeuropathies of left lower limb: Secondary | ICD-10-CM

## 2016-06-02 NOTE — Progress Notes (Signed)
Subjective:     Patient ID: Kristen Lin, female   DOB: 1930-10-20, 83 y.o.   MRN: [id number]  HPI patient states she's improved but still having some discomfort   Review of Systems     Objective:   Physical Exam Neurovascular status intact with shooting pain third interspace left that has improved from previous visit    Assessment:     Improve neuroma symptomatology    Plan:     We will continue injections but hopefully last one and I did a sterile prep of the area injected directly into the nerve with a purified alcohol Marcaine solution 1.5 mL that was tolerated well

## 2016-06-20 DIAGNOSIS — M8588 Other specified disorders of bone density and structure, other site: Secondary | ICD-10-CM | POA: Diagnosis not present

## 2016-07-12 DIAGNOSIS — M8580 Other specified disorders of bone density and structure, unspecified site: Secondary | ICD-10-CM | POA: Diagnosis not present

## 2016-07-25 ENCOUNTER — Other Ambulatory Visit: Payer: Self-pay | Admitting: Family Medicine

## 2016-07-25 DIAGNOSIS — Z1231 Encounter for screening mammogram for malignant neoplasm of breast: Secondary | ICD-10-CM

## 2016-08-16 ENCOUNTER — Ambulatory Visit
Admission: RE | Admit: 2016-08-16 | Discharge: 2016-08-16 | Disposition: A | Discharge status: Home / Self Care | Payer: Medicare HMO | Source: Ambulatory Visit | Attending: Family Medicine | Admitting: Family Medicine

## 2016-08-16 DIAGNOSIS — Z1231 Encounter for screening mammogram for malignant neoplasm of breast: Secondary | ICD-10-CM | POA: Diagnosis not present

## 2016-09-09 DIAGNOSIS — S8012XA Contusion of left lower leg, initial encounter: Secondary | ICD-10-CM | POA: Diagnosis not present

## 2016-09-16 DIAGNOSIS — B351 Tinea unguium: Secondary | ICD-10-CM | POA: Diagnosis not present

## 2016-10-24 DIAGNOSIS — H524 Presbyopia: Secondary | ICD-10-CM | POA: Diagnosis not present

## 2016-10-24 DIAGNOSIS — Z961 Presence of intraocular lens: Secondary | ICD-10-CM | POA: Diagnosis not present

## 2016-10-24 DIAGNOSIS — H43813 Vitreous degeneration, bilateral: Secondary | ICD-10-CM | POA: Diagnosis not present

## 2016-10-24 DIAGNOSIS — H01001 Unspecified blepharitis right upper eyelid: Secondary | ICD-10-CM | POA: Diagnosis not present

## 2016-11-07 DIAGNOSIS — E7800 Pure hypercholesterolemia, unspecified: Secondary | ICD-10-CM | POA: Diagnosis not present

## 2016-11-07 DIAGNOSIS — D72820 Lymphocytosis (symptomatic): Secondary | ICD-10-CM | POA: Diagnosis not present

## 2016-11-07 DIAGNOSIS — K219 Gastro-esophageal reflux disease without esophagitis: Secondary | ICD-10-CM | POA: Diagnosis not present

## 2016-11-07 DIAGNOSIS — E039 Hypothyroidism, unspecified: Secondary | ICD-10-CM | POA: Diagnosis not present

## 2016-11-07 DIAGNOSIS — R42 Dizziness and giddiness: Secondary | ICD-10-CM | POA: Diagnosis not present

## 2017-05-11 DIAGNOSIS — Z1389 Encounter for screening for other disorder: Secondary | ICD-10-CM | POA: Diagnosis not present

## 2017-05-11 DIAGNOSIS — Z0001 Encounter for general adult medical examination with abnormal findings: Secondary | ICD-10-CM | POA: Diagnosis not present

## 2017-05-11 DIAGNOSIS — E7800 Pure hypercholesterolemia, unspecified: Secondary | ICD-10-CM | POA: Diagnosis not present

## 2017-05-11 DIAGNOSIS — K219 Gastro-esophageal reflux disease without esophagitis: Secondary | ICD-10-CM | POA: Diagnosis not present

## 2017-05-11 DIAGNOSIS — Z23 Encounter for immunization: Secondary | ICD-10-CM | POA: Diagnosis not present

## 2017-05-11 DIAGNOSIS — E039 Hypothyroidism, unspecified: Secondary | ICD-10-CM | POA: Diagnosis not present

## 2017-05-11 DIAGNOSIS — M79672 Pain in left foot: Secondary | ICD-10-CM | POA: Diagnosis not present

## 2017-05-11 DIAGNOSIS — M8580 Other specified disorders of bone density and structure, unspecified site: Secondary | ICD-10-CM | POA: Diagnosis not present

## 2017-05-11 DIAGNOSIS — R413 Other amnesia: Secondary | ICD-10-CM | POA: Diagnosis not present

## 2017-06-08 ENCOUNTER — Ambulatory Visit: Payer: Medicare HMO | Admitting: Neurology

## 2017-06-08 ENCOUNTER — Encounter (INDEPENDENT_AMBULATORY_CARE_PROVIDER_SITE_OTHER): Payer: Self-pay

## 2017-06-08 ENCOUNTER — Encounter: Payer: Self-pay | Admitting: Neurology

## 2017-06-08 ENCOUNTER — Institutional Professional Consult (permissible substitution): Payer: Medicare HMO | Admitting: Neurology

## 2017-06-08 VITALS — BP 110/68 | HR 64 | Ht <= 58 in | Wt 131.5 lb

## 2017-06-08 DIAGNOSIS — R413 Other amnesia: Secondary | ICD-10-CM | POA: Diagnosis not present

## 2017-06-08 DIAGNOSIS — R69 Illness, unspecified: Secondary | ICD-10-CM | POA: Diagnosis not present

## 2017-06-08 DIAGNOSIS — F439 Reaction to severe stress, unspecified: Secondary | ICD-10-CM | POA: Diagnosis not present

## 2017-06-08 NOTE — Progress Notes (Signed)
Subjective:    Patient ID: NADENE WITHERSPOON is a 84 y.o. female.  HPI     Star Age, MD, PhD [facility] [address], [address] [place], [place] [address]   Dear Dr. Tamala Julian,  I saw your patient, Kerisha Goughnour, upon your kind request in my neurologic clinic today for initial consultation of her memory loss. The patient is accompanied by her husband today. As you know, Ms. Eunice is an 84 year old right-handed woman with an underlying medical history of hypothyroidism, reflux disease, hyperlipidemia, recurrent vertigo, hypertension, restless leg syndrome, osteopenia, and overweight state, who reports forgetfulness and misplacing things for the past several months, maybe a year or even 2 per husband's report. I reviewed your office note from 05/11/2017, which you kindly included. She had blood work in your office in January 2019 which I reviewed, CBC with differential was unremarkable with the exception of elevated lymphocyte percentage minimally. CMP was unremarkable, TSH unremarkable, lipid profile unremarkable. I have seen her once before for vertigo at the request of her ENT physician. She had a brain MRI with and without contrast on 09/12/2014 which showed:   IMPRESSION:  This is an abnormal MRI of the brain with and without contrast showing the following: 1.   Moderate extent of T2/flair hyperintense foci in the hemispheres and a few in the basal ganglia consistent with age-related small vessel ischemic change. There has been very little progression when compared to the study dated 12/16/2005. The extent is a little more than expected for age. Brain volume is normal for age. 2.   The internal auditory canals and the brainstem appear normal.  She is a nonsmoker and does not utilize alcohol, she does not drink excessive caffeine, limit herself to half a cup of coffee and one cup of tea per day on average. She has a ninth grade education. She lives with her husband.  This is her second marriage, her husband second marriage as well. She has 4 children from her first marriage. Unfortunately, she has had stressors particularly with her youngest son. They have also worried about maintaining their large home and are discussing with her daughter whether they could move in with her daughter and her family. Her daughter has offered it to them. The patient drives without difficulty when it is local roads and familiar routes. There is no family history of dementia. She is the youngest of a total of 4 kids. She had 2 sisters and one brother. She herself has 2 sons and 2 girls. She has had recent stressors also surrounding her husband's health. They have been married 26 years. 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   Previously:  09/01/2014: Ms. Dickison is an 84 -year-old right-handed woman with an underlying medical history of hyperlipidemia, reflux disease, hypothyroidism and overweight state, who has suffered from recurrent vertiginous spells as well as tinnitus for the past several years. Symptoms have been more progressive in the last 4-5 months. She is able to provide most of her history herself but her husband provides some details. He is asking whether stress can exacerbate her symptoms. Of note, her youngest son, age 46 moved b with them in with them some 6 months ago. Unfortunately, he has mental health problems and is not able to keep a job. He was in a homeless shelter but was not able to say if there any longer. This has created a lot of friction between the patient's husband  and her son. She tries to keep the peace at home. They were hoping to sell their home and move into a retirement community but this has been put on hold because they don't know where her son will go. She has had intermittent vertigo almost on a daily basis. She feels that the room is spinning. Sometimes her ringing in her ears is the worst symptom. This  sounds like a loud screeching noise and causes her to have a funny feeling in her head. She denies any headaches, one-sided weakness, numbness, slurring of speech or droopy face. She does not have a history of migraine in the past. I reviewed your office notes from 08/21/14 and 08/26/2014, which you kindly included. She also had a hearing test done which showed bilateral sensorineural hearing loss, on 08/26/2014. She had a Dix-Hallpike examination on 08/21/2014 which was reported as negative. She had  She has been married 23 years, but has known her current husband since teenage years, and they both were widowed. She has 4 children and he has 2 daughters.  In the past, for indications of dizziness, she had a brain MRI w/wo on 12/16/05, which I reviewed: No specific cause of the patient's described symptoms is identified. There is ordinary brain atrophy and chronic small vessel change without evidence of an acute stroke or any other reversible process. Specifically, no lesion is seen in the region of either temporal bone or CP angle.   In addition, I personally reviewed the images through the PACS system and also shared some images on the computer with them to explain findings, in particular white matter changes of her brain.   She has used meclizine intermittently which reasonable success in the past but not recently.   Her Past Medical History Is Significant For: Past Medical History:  Diagnosis Date  . Hyperlipemia   . Vertigo     Her Past Surgical History Is Significant For: No past surgical history on file.  Her Family History Is Significant For: Family History  Problem Relation Age of Onset  . Breast cancer Daughter   . Breast cancer Grandchild     Her Social History Is Significant For: Social History   Socioeconomic History  . Marital status: Married    Spouse name: None  . Number of children: 4  . Years of education: 10th grade  . Highest education level: None  Social Needs   . Financial resource strain: None  . Food insecurity - worry: None  . Food insecurity - inability: None  . Transportation needs - medical: None  . Transportation needs - non-medical: None  Occupational History  . Occupation: Retired  Tobacco Use  . Smoking status: Never Smoker  . Smokeless tobacco: Never Used  Substance and Sexual Activity  . Alcohol use: No    Alcohol/week: 0.0 oz  . Drug use: No  . Sexual activity: None  Other Topics Concern  . None  Social History Narrative   1 cup of coffee a day, 2-3 diet drinks a day     Her Allergies Are:  Allergies  Allergen Reactions  . Penicillins   :   Her Current Medications Are:  Outpatient Encounter Medications as of 06/08/2017  Medication Sig  . aspirin 81 MG tablet Take 81 mg by mouth daily.  . Cyanocobalamin (VITAMIN B-12 PO) Take by mouth.  . ibandronate (BONIVA) 150 MG tablet Pt takes once a month  . Levothyroxine Sodium 88 MCG CAPS Take by mouth daily before breakfast.  .  lovastatin (MEVACOR) 40 MG tablet Take 40 mg by mouth at bedtime.  . meclizine (ANTIVERT) 25 MG tablet Take 25 mg by mouth 3 (three) times daily as needed for dizziness.  Marland Kitchen omeprazole (PRILOSEC) 20 MG capsule Take 20 mg by mouth daily.   No facility-administered encounter medications on file as of 06/08/2017.   :  Review of Systems:  Out of a complete 14 point review of systems, all are reviewed and negative with the exception of these symptoms as listed be Review of Systems  Neurological:       Pt presents today to discuss her memory.     Objective:  Neurological Exam  Physical Exam Physical Examination:   Vitals:   06/08/17 1421  BP: 110/68  Pulse: 64   General Examination: The patient is a very pleasant 84 y.o. female in no acute distress. She appears well-developed and well-nourished and well groomed.    HEENT: Normocephalic, atraumatic, pupils are equal, round and reactive to light and accommodation. Funduscopic exam is normal with  sharp disc margins noted. Extraocular tracking is good without limitation to gaze excursion or nystagmus noted. Normal smooth pursuit is noted. Hearing is grossly intact. Tympanic membranes are clear bilaterally. Face is symmetric with normal facial animation and normal facial sensation. Speech is clear with no dysarthria noted. She denies any tinnitus currently. She does not have any nystagmus or vertiginous symptoms upon sudden changes of her head position. There is no hypophonia. There is no lip, neck/head, jaw or voice tremor. Neck is supple with full range of passive and active motion. There are no carotid bruits on auscultation. Oropharynx exam reveals: moderate mouth dryness, adequate dental hygiene and mild airway crowding. Mallampati is class II. Tongue protrudes centrally and palate elevates symmetrically.   Chest: Clear to auscultation without wheezing, rhonchi or crackles noted.  Heart: S1+S2+0, regular and normal without murmurs, rubs or gallops noted.   Abdomen: Soft, non-tender and non-distended with normal bowel sounds appreciated on auscultation.  Extremities: There is no pitting edema in the distal lower extremities bilaterally. Pedal pulses are intact.  Skin: Warm and dry without trophic changes noted. There are no varicose veins.  Musculoskeletal: exam reveals no obvious joint deformities, tenderness or joint swelling or erythema.   Neurologically:  Mental status: The patient is awake, alert and oriented in all 4 spheres. Her immediate and remote memory, attention, language skills and fund of knowledge are appropriate. There is no evidence of aphasia, agnosia, apraxia or anomia. Speech is clear with normal prosody and enunciation. Thought process is linear. Mood is normal and affect is normal.   On 06/08/2017: MMSE: 28/30, CDT: 4/4, AFT: 10/min.   Cranial nerves II - XII are as described above under HEENT exam.   Motor exam: Normal bulk, strength and tone is noted. There  is no drift, tremor or rebound. Reflexes are 1+ throughout. Fine motor skills and coordination: intact with normal finger taps, normal hand movements, normal rapid alternating patting, normal foot taps and normal foot agility.  Cerebellar testing: No dysmetria or intention tremor. There is no truncal or gait ataxia.  Sensory exam: intact to light touch in the upper and lower extremities.  Gait, station and balance: She stands with mild difficulty. No veering to one side is noted. No leaning to one side is noted. Posture is age-appropriate and stance is narrow based. Gait shows mild insecurity when walking. But able to walk independently.   Assessment and Plan:   In summary, CHONTE RICKE is  a very pleasant 84 year old female with an underlying medical history of hyperlipidemia, reflux disease, hypothyroidism, recurrent vertigo and overweight state, who presents for neurologic consultation of her memory loss. She has had some forgetfulness and misplacing things, minor, possibly age-appropriate issues. Her memory score is certainly not bad today. She does endorse quite a bit of stressors lately what with her husband's health, her son's issues and not being able to maintain a big household by themselves any longer. This weighs heavily on her. From my end of things I would like to avoid any new medication at this time, I do not think we need to initiate dementia medication, I would like to investigate things further with a brain MRI and also compare with findings from May 2016. In addition, I would like to pursue formal cognitive testing with the help of a neuropsychologist. She is amenable to these 2 referrals. I would like to have them discuss with her daughter the possibility of moving in with her daughter and her family as this has been offered to them and it seems like a good idea for them to think about longer term living situation for both of them. She is advised to restrict her driving to familiar and  local routes only, daytime driving, no highway driving.  She had some blood work recently. We will keep her posted as to her MRI results and I will see her back routinely in 6 months, sooner as needed. I answered all their questions today and the patient and her husband were in agreement.  Thank you very much for allowing me to participate in the care of this nice patient. If I can be of any further assistance to you please do not hesitate to call me at [phone].  Sincerely,   Star Age, MD, PhD

## 2017-06-08 NOTE — Patient Instructions (Signed)
Please monitor your driving.  I would suggest you limit yourself to local roads, familiar routes, no nighttime and no highway driving. You have complaints of memory loss: memory loss or changes in cognitive function can have many reasons and does not always mean you have dementia. Conditions that can contribute to subjective or objective memory loss include: depression, stress, poor sleep from insomnia or sleep apnea, dehydration, fluctuation in blood sugar values, thyroid or electrolyte dysfunction and certain vitamin deficiencies. Dementia can be caused by stroke, brain atherosclerosis or brain vascular disease due to vascular risk factors (smoking, high blood pressure, high cholesterol, obesity and uncontrolled diabetes), certain degenerative brain disorders (including Parkinson's disease and Multiple sclerosis) and by Alzheimer's disease or other, more rare and sometimes hereditary causes. We will do some additional testing: you recently had blood work, and we will do a brain scan. We will not start medication as yet. We will also request a formal cognitive test called neuropsychological evaluation which is done by a licensed neuropsychologist. We will make a referral in that regard. We will call you with brain scan test results and monitor your symptoms. Your memory loss is rather mild at this point, which, of course is reassuring.

## 2017-08-24 ENCOUNTER — Other Ambulatory Visit: Payer: Self-pay | Admitting: Family Medicine

## 2017-08-24 DIAGNOSIS — Z1231 Encounter for screening mammogram for malignant neoplasm of breast: Secondary | ICD-10-CM

## 2017-09-28 ENCOUNTER — Ambulatory Visit
Admission: RE | Admit: 2017-09-28 | Discharge: 2017-09-28 | Disposition: A | Discharge status: Home / Self Care | Payer: Medicare HMO | Source: Ambulatory Visit | Attending: Family Medicine | Admitting: Family Medicine

## 2017-09-28 DIAGNOSIS — Z1231 Encounter for screening mammogram for malignant neoplasm of breast: Secondary | ICD-10-CM | POA: Diagnosis not present

## 2017-11-24 DIAGNOSIS — H04123 Dry eye syndrome of bilateral lacrimal glands: Secondary | ICD-10-CM | POA: Diagnosis not present

## 2017-11-24 DIAGNOSIS — H353131 Nonexudative age-related macular degeneration, bilateral, early dry stage: Secondary | ICD-10-CM | POA: Diagnosis not present

## 2017-11-24 DIAGNOSIS — H52203 Unspecified astigmatism, bilateral: Secondary | ICD-10-CM | POA: Diagnosis not present

## 2017-11-24 DIAGNOSIS — H43813 Vitreous degeneration, bilateral: Secondary | ICD-10-CM | POA: Diagnosis not present

## 2017-12-06 ENCOUNTER — Ambulatory Visit: Payer: Medicare HMO | Admitting: Neurology

## 2018-03-13 DIAGNOSIS — R0789 Other chest pain: Secondary | ICD-10-CM | POA: Diagnosis not present

## 2018-03-13 DIAGNOSIS — Z23 Encounter for immunization: Secondary | ICD-10-CM | POA: Diagnosis not present

## 2018-03-13 DIAGNOSIS — R634 Abnormal weight loss: Secondary | ICD-10-CM | POA: Diagnosis not present

## 2018-05-14 DIAGNOSIS — Z1389 Encounter for screening for other disorder: Secondary | ICD-10-CM | POA: Diagnosis not present

## 2018-05-14 DIAGNOSIS — E039 Hypothyroidism, unspecified: Secondary | ICD-10-CM | POA: Diagnosis not present

## 2018-05-14 DIAGNOSIS — Z0000 Encounter for general adult medical examination without abnormal findings: Secondary | ICD-10-CM | POA: Diagnosis not present

## 2018-05-14 DIAGNOSIS — M8580 Other specified disorders of bone density and structure, unspecified site: Secondary | ICD-10-CM | POA: Diagnosis not present

## 2018-05-14 DIAGNOSIS — E7800 Pure hypercholesterolemia, unspecified: Secondary | ICD-10-CM | POA: Diagnosis not present

## 2018-08-22 ENCOUNTER — Other Ambulatory Visit: Payer: Self-pay | Admitting: Family Medicine

## 2018-08-22 DIAGNOSIS — Z1231 Encounter for screening mammogram for malignant neoplasm of breast: Secondary | ICD-10-CM

## 2018-10-22 ENCOUNTER — Ambulatory Visit: Payer: Medicare HMO

## 2018-10-23 ENCOUNTER — Ambulatory Visit
Admission: RE | Admit: 2018-10-23 | Discharge: 2018-10-23 | Disposition: A | Discharge status: Home / Self Care | Payer: Medicare HMO | Source: Ambulatory Visit | Attending: Family Medicine | Admitting: Family Medicine

## 2018-10-23 ENCOUNTER — Other Ambulatory Visit: Payer: Self-pay

## 2018-10-23 DIAGNOSIS — Z1231 Encounter for screening mammogram for malignant neoplasm of breast: Secondary | ICD-10-CM

## 2018-11-26 DIAGNOSIS — H04123 Dry eye syndrome of bilateral lacrimal glands: Secondary | ICD-10-CM | POA: Diagnosis not present

## 2018-11-26 DIAGNOSIS — H353131 Nonexudative age-related macular degeneration, bilateral, early dry stage: Secondary | ICD-10-CM | POA: Diagnosis not present

## 2018-11-26 DIAGNOSIS — H52203 Unspecified astigmatism, bilateral: Secondary | ICD-10-CM | POA: Diagnosis not present

## 2018-11-26 DIAGNOSIS — H43813 Vitreous degeneration, bilateral: Secondary | ICD-10-CM | POA: Diagnosis not present

## 2019-04-11 DIAGNOSIS — E039 Hypothyroidism, unspecified: Secondary | ICD-10-CM | POA: Diagnosis not present

## 2019-04-11 DIAGNOSIS — Z89021 Acquired absence of right finger(s): Secondary | ICD-10-CM | POA: Diagnosis not present

## 2019-04-11 DIAGNOSIS — Z7982 Long term (current) use of aspirin: Secondary | ICD-10-CM | POA: Diagnosis not present

## 2019-04-11 DIAGNOSIS — K219 Gastro-esophageal reflux disease without esophagitis: Secondary | ICD-10-CM | POA: Diagnosis not present

## 2019-04-11 DIAGNOSIS — I252 Old myocardial infarction: Secondary | ICD-10-CM | POA: Diagnosis not present

## 2019-04-11 DIAGNOSIS — R42 Dizziness and giddiness: Secondary | ICD-10-CM | POA: Diagnosis not present

## 2019-04-11 DIAGNOSIS — Z880 Allergy status to penicillin: Secondary | ICD-10-CM | POA: Diagnosis not present

## 2019-04-11 DIAGNOSIS — E785 Hyperlipidemia, unspecified: Secondary | ICD-10-CM | POA: Diagnosis not present

## 2019-04-24 DIAGNOSIS — R413 Other amnesia: Secondary | ICD-10-CM | POA: Diagnosis not present

## 2019-04-24 DIAGNOSIS — R195 Other fecal abnormalities: Secondary | ICD-10-CM | POA: Diagnosis not present

## 2019-05-08 DIAGNOSIS — Z23 Encounter for immunization: Secondary | ICD-10-CM | POA: Diagnosis not present

## 2019-09-27 ENCOUNTER — Other Ambulatory Visit: Payer: Self-pay | Admitting: Family Medicine

## 2019-09-27 DIAGNOSIS — Z1231 Encounter for screening mammogram for malignant neoplasm of breast: Secondary | ICD-10-CM

## 2019-11-19 ENCOUNTER — Other Ambulatory Visit: Payer: Self-pay

## 2019-11-19 ENCOUNTER — Ambulatory Visit
Admission: RE | Admit: 2019-11-19 | Discharge: 2019-11-19 | Disposition: A | Discharge status: Home / Self Care | Payer: Medicare HMO | Source: Ambulatory Visit | Attending: Family Medicine | Admitting: Family Medicine

## 2019-11-19 DIAGNOSIS — Z1231 Encounter for screening mammogram for malignant neoplasm of breast: Secondary | ICD-10-CM

## 2020-10-23 ENCOUNTER — Other Ambulatory Visit: Payer: Self-pay | Admitting: Family Medicine

## 2020-10-28 ENCOUNTER — Other Ambulatory Visit: Payer: Self-pay | Admitting: Family Medicine

## 2020-10-28 DIAGNOSIS — Z1231 Encounter for screening mammogram for malignant neoplasm of breast: Secondary | ICD-10-CM

## 2020-12-22 ENCOUNTER — Other Ambulatory Visit: Payer: Self-pay

## 2020-12-22 ENCOUNTER — Ambulatory Visit
Admission: RE | Admit: 2020-12-22 | Discharge: 2020-12-22 | Disposition: A | Discharge status: Home / Self Care | Payer: Medicare HMO | Source: Ambulatory Visit | Attending: Family Medicine | Admitting: Family Medicine

## 2020-12-22 DIAGNOSIS — Z1231 Encounter for screening mammogram for malignant neoplasm of breast: Secondary | ICD-10-CM

## 2021-11-16 ENCOUNTER — Other Ambulatory Visit: Payer: Self-pay | Admitting: Family Medicine

## 2021-11-16 DIAGNOSIS — Z1231 Encounter for screening mammogram for malignant neoplasm of breast: Secondary | ICD-10-CM

## 2021-12-23 ENCOUNTER — Ambulatory Visit: Payer: Medicare HMO

## 2021-12-23 ENCOUNTER — Ambulatory Visit
Admission: RE | Admit: 2021-12-23 | Discharge: 2021-12-23 | Disposition: A | Discharge status: Home / Self Care | Payer: Medicare Other | Source: Ambulatory Visit | Attending: Family Medicine | Admitting: Family Medicine

## 2021-12-23 DIAGNOSIS — Z1231 Encounter for screening mammogram for malignant neoplasm of breast: Secondary | ICD-10-CM

## 2023-03-08 IMAGING — MG MM DIGITAL SCREENING BILAT W/ TOMO AND CAD
6 of 10 series · 6 of 30 positions shown · non-contrast
Comparison: Previous exam(s).

CLINICAL DATA: Screening.

EXAM:
DIGITAL SCREENING BILATERAL MAMMOGRAM WITH TOMOSYNTHESIS AND CAD
TECHNIQUE: Bilateral screening digital craniocaudal and mediolateral oblique
mammograms were obtained. Bilateral screening digital breast
tomosynthesis was performed. The images were evaluated with
computer-aided detection.

[R CC synth-2D]
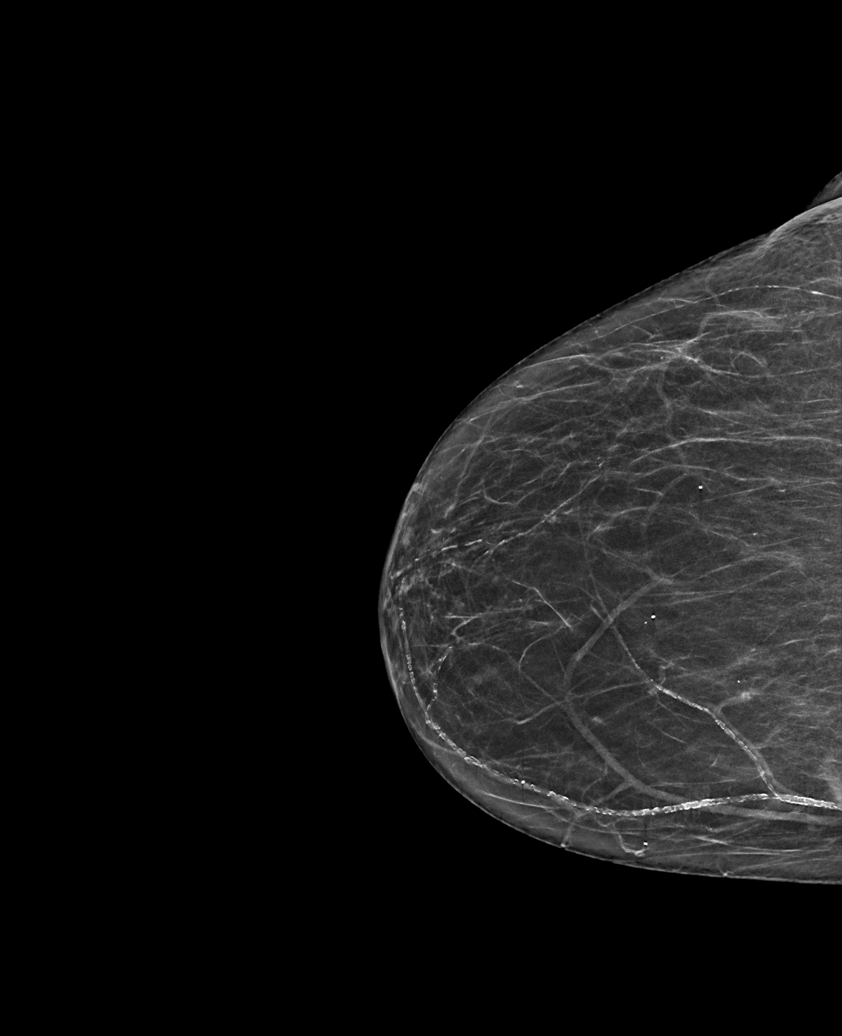

[R MLO synth-2D (1 of 2)]
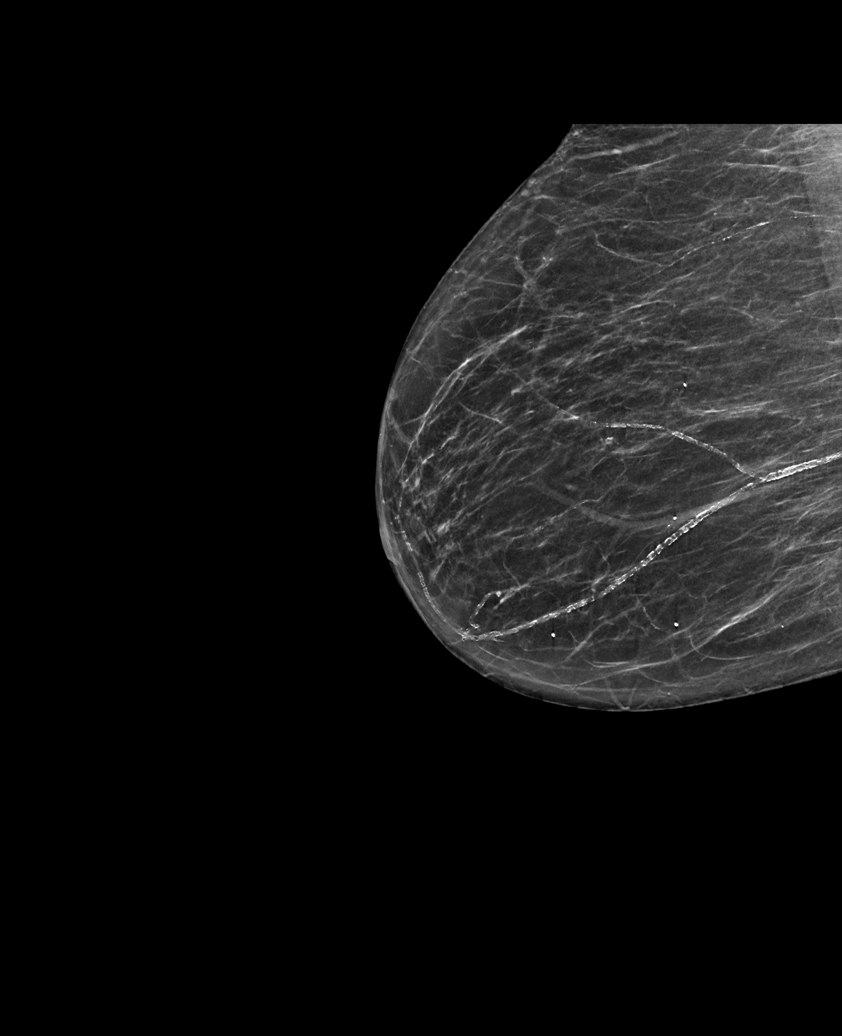

[L MLO synth-2D]
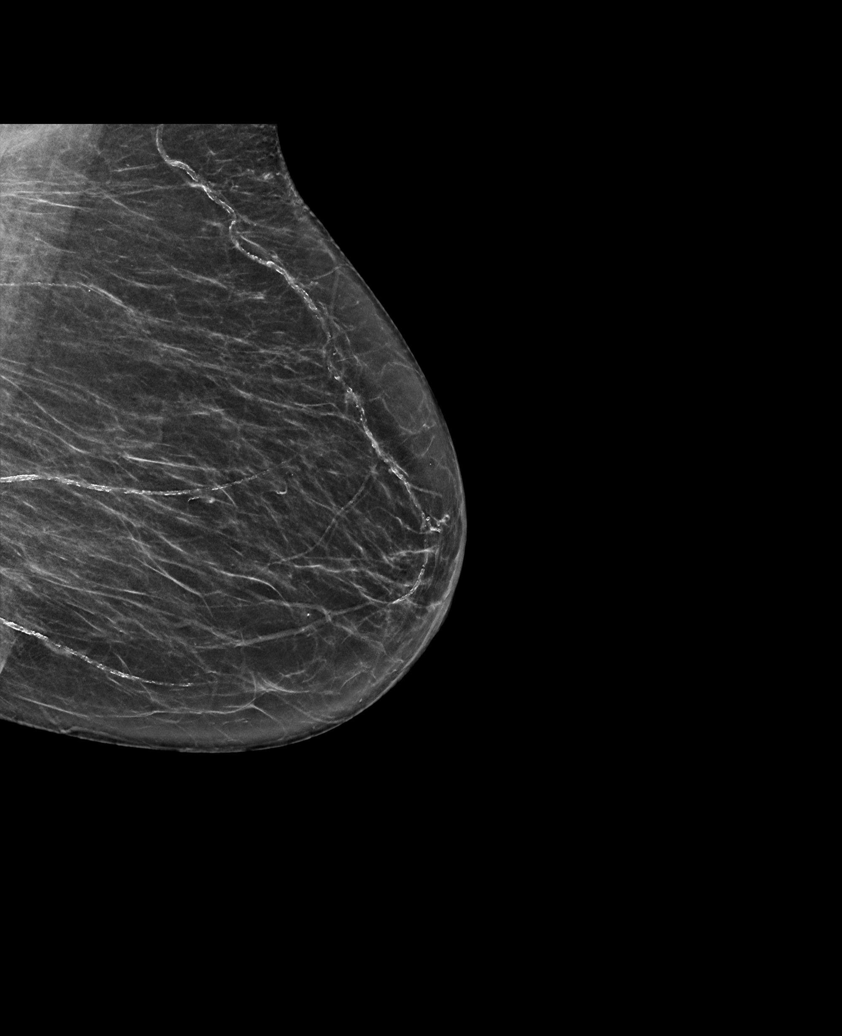

[L CC synth-2D]
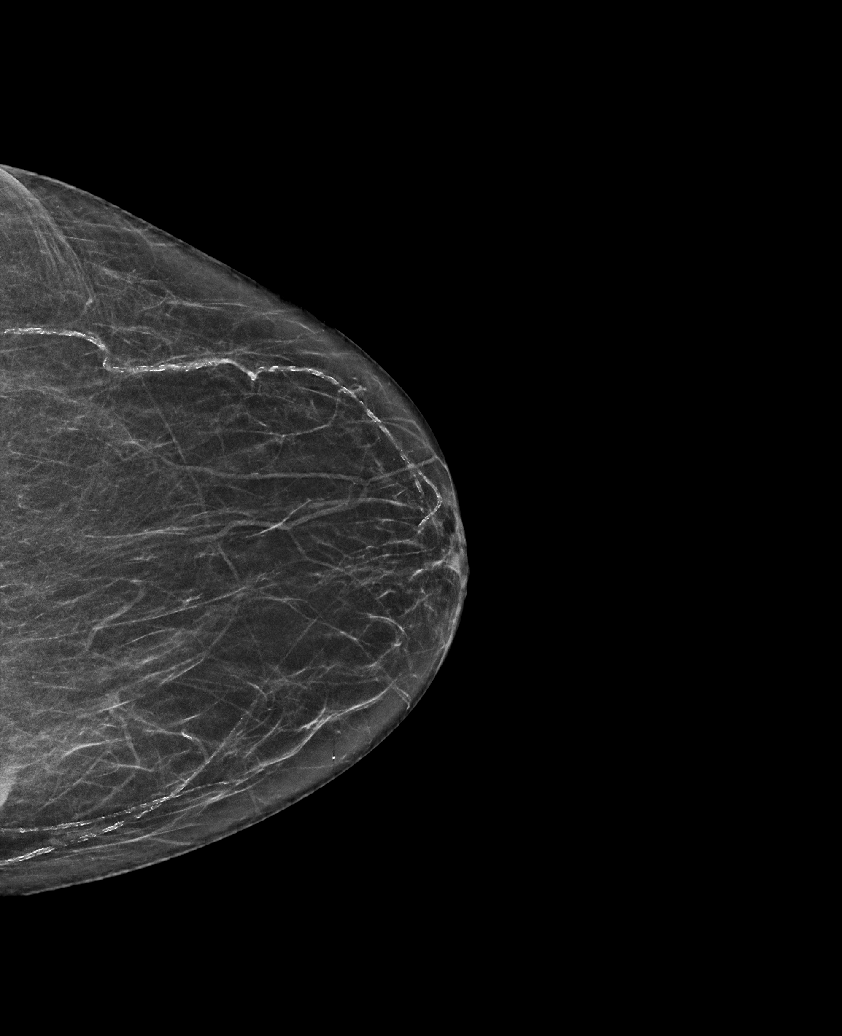

[R MLO synth-2D (2 of 2)]
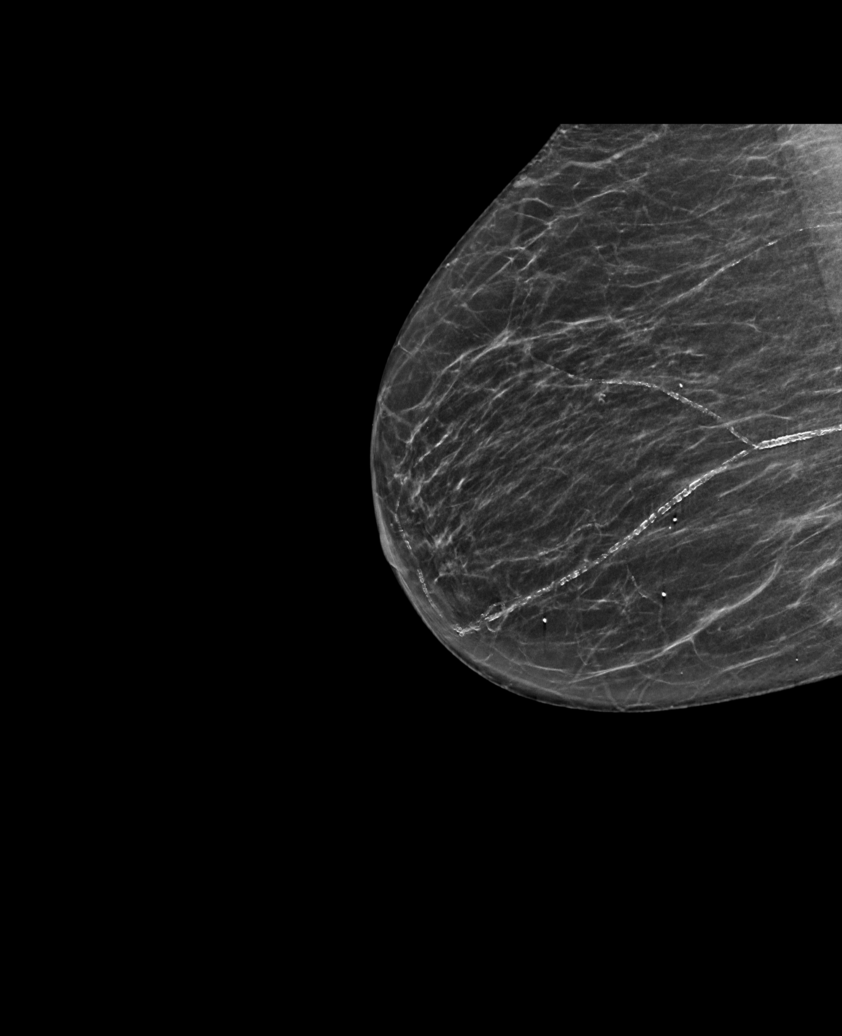

[R MLO tomo · tomo slice 27/52.0]
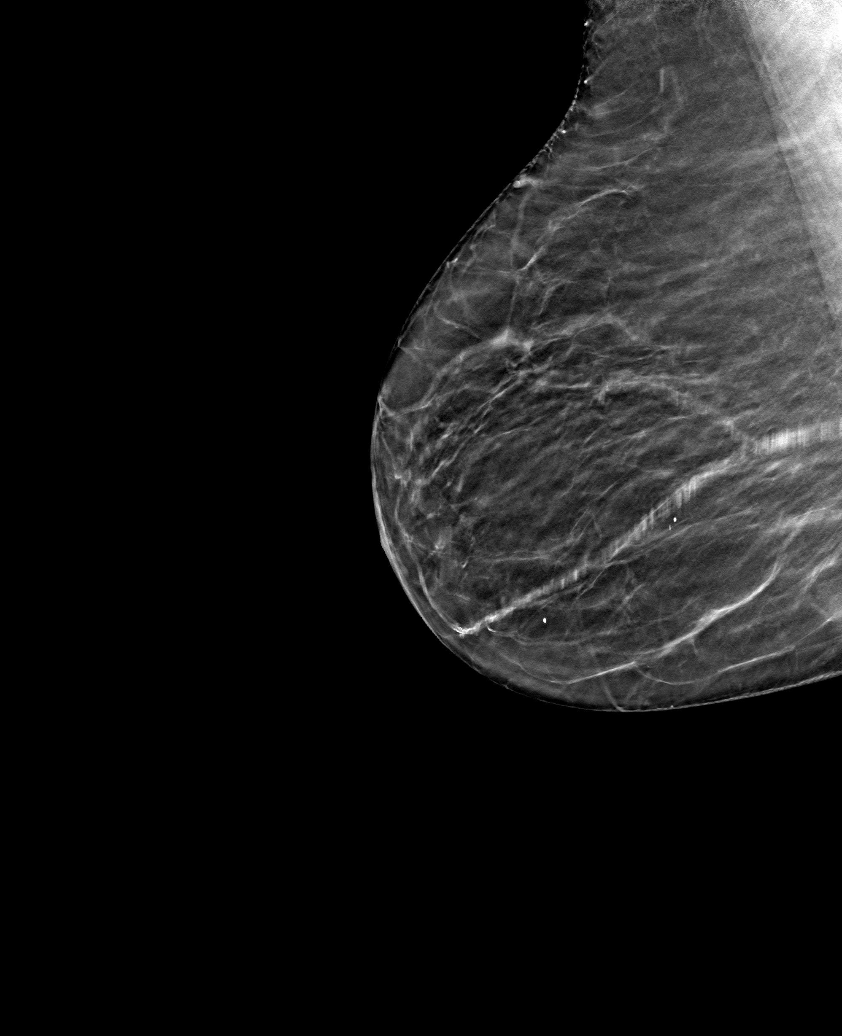

[6 of 30 positions shown; findings below may reference images not displayed]

ACR Breast Density Category b: There are scattered areas of
fibroglandular density.
FINDINGS: There are no findings suspicious for malignancy.
IMPRESSION: No mammographic evidence of malignancy. A result letter of this
screening mammogram will be mailed directly to the patient.

RECOMMENDATION:
Screening mammogram in one year. (Code:51-O-LD2)

BI-RADS CATEGORY  1: Negative.

## 2023-06-28 ENCOUNTER — Other Ambulatory Visit: Payer: Self-pay | Admitting: Family Medicine

## 2023-06-28 DIAGNOSIS — Z1231 Encounter for screening mammogram for malignant neoplasm of breast: Secondary | ICD-10-CM

## 2023-07-27 ENCOUNTER — Ambulatory Visit
Admission: RE | Admit: 2023-07-27 | Discharge: 2023-07-27 | Disposition: A | Discharge status: Home / Self Care | Payer: Medicare Other | Source: Ambulatory Visit | Attending: Family Medicine | Admitting: Family Medicine

## 2023-07-27 DIAGNOSIS — Z1231 Encounter for screening mammogram for malignant neoplasm of breast: Secondary | ICD-10-CM

## 2023-12-18 ENCOUNTER — Encounter: Payer: Self-pay | Admitting: Emergency Medicine

## 2023-12-18 ENCOUNTER — Ambulatory Visit
Admission: EM | Admit: 2023-12-18 | Discharge: 2023-12-18 | Disposition: A | Discharge status: Home / Self Care | Attending: Nurse Practitioner | Admitting: Nurse Practitioner

## 2023-12-18 DIAGNOSIS — H6123 Impacted cerumen, bilateral: Secondary | ICD-10-CM | POA: Diagnosis not present

## 2023-12-18 NOTE — ED Provider Notes (Signed)
 [facility]    CSN: [id number] Arrival date & time: 12/18/23  1503      History   Chief Complaint Chief Complaint  Patient presents with   Wax in ears    HPI Kristen Lin is a 89 y.o. female.   Patient presents requesting ear cleaning after being advised by her audiologist earlier today during a hearing aid adjustment that both ears were full of wax. She denies ear pain, decreased hearing, dizziness, tinnitus, drainage, or headache. No other associated symptoms reported.  The following portions of the patient's history were reviewed and updated as appropriate: allergies, current medications, past family history, past medical history, past social history, past surgical history, and problem list.    Past Medical History:  Diagnosis Date   Hyperlipemia    Vertigo     There are no active problems to display for this patient.   History reviewed. No pertinent surgical history.  OB History   No obstetric history on file.      Home Medications    Prior to Admission medications   Medication Sig Start Date End Date Taking? Authorizing Provider  aspirin 81 MG tablet Take 81 mg by mouth daily. Patient not taking: Reported on 12/18/2023    [provider]  Cyanocobalamin (VITAMIN B-12 PO) Take by mouth.    [provider]  ibandronate (BONIVA) 150 MG tablet Pt takes once a month 08/21/14   [provider]  Levothyroxine Sodium 88 MCG CAPS Take by mouth daily before breakfast.    [provider]  lovastatin (MEVACOR) 40 MG tablet Take 40 mg by mouth at bedtime.    [provider]  meclizine (ANTIVERT) 25 MG tablet Take 25 mg by mouth 3 (three) times daily as needed for dizziness.    [provider]  omeprazole (PRILOSEC) 20 MG capsule Take 20 mg by mouth daily.    [provider]    Family History Family History  Problem Relation Age of Onset   Breast cancer Daughter    Breast cancer Grandchild      Social History Social History   Tobacco Use   Smoking status: Never   Smokeless tobacco: Never  Substance Use Topics   Alcohol use: No    Alcohol/week: 0.0 standard drinks of alcohol   Drug use: No     Allergies   Penicillins   Review of Systems Review of Systems  HENT:  Negative for ear discharge, ear pain, hearing loss and tinnitus.   Neurological:  Negative for dizziness and headaches.  All other systems reviewed and are negative.    Physical Exam Triage Vital Signs ED Triage Vitals  Encounter Vitals Group     BP 12/18/23 1558 116/63     Girls Systolic BP Percentile --      Girls Diastolic BP Percentile --      Boys Systolic BP Percentile --      Boys Diastolic BP Percentile --      Pulse Rate 12/18/23 1558 (!) 58     Resp 12/18/23 1558 20     Temp 12/18/23 1558 98.4 F (36.9 C)     Temp Source 12/18/23 1558 Oral     SpO2 12/18/23 1558 95 %     Weight --      Height --      Head Circumference --      Peak Flow --      Pain Score 12/18/23 1559 0     Pain  Loc --      Pain Education --      Exclude from Growth Chart --    No data found.  Updated Vital Signs BP 116/63 (BP Location: Left Arm)   Pulse (!) 58   Temp 98.4 F (36.9 C) (Oral)   Resp 20   SpO2 95%   Visual Acuity Right Eye Distance:   Left Eye Distance:   Bilateral Distance:    Right Eye Near:   Left Eye Near:    Bilateral Near:     Physical Exam Vitals reviewed.  Constitutional:      General: She is awake. She is not in acute distress.    Appearance: Normal appearance. She is well-developed. She is not ill-appearing, toxic-appearing or diaphoretic.  HENT:     Head: Normocephalic.     Right Ear: Hearing normal. There is impacted cerumen.     Left Ear: Hearing normal. There is impacted cerumen.     Nose: Nose normal.     Mouth/Throat:     Mouth: Mucous membranes are moist.  Eyes:     General: Vision grossly intact.     Conjunctiva/sclera: Conjunctivae normal.   Cardiovascular:     Rate and Rhythm: Normal rate and regular rhythm.     Heart sounds: Normal heart sounds.  Pulmonary:     Effort: Pulmonary effort is normal.     Breath sounds: Normal breath sounds and air entry.  Musculoskeletal:        General: Normal range of motion.     Cervical back: Full passive range of motion without pain, normal range of motion and neck supple.  Skin:    General: Skin is warm and dry.  Neurological:     General: No focal deficit present.     Mental Status: She is alert and oriented to person, place, and time.  Psychiatric:        Speech: Speech normal.        Behavior: Behavior is cooperative.      UC Treatments / Results  Labs (all labs ordered are listed, but only abnormal results are displayed) Labs Reviewed - No data to display  EKG   Radiology No results found.  Procedures Ear Cerumen Removal  Date/Time: 12/18/2023 9:42 PM  Performed by: Maurice Juline HERO, CMA Authorized by: Iola Lukes, FNP   Consent:    Consent obtained:  Verbal   Consent given by:  Patient   Risks, benefits, and alternatives were discussed: yes     Risks discussed:  Bleeding, infection, pain, dizziness, incomplete removal and TM perforation Universal protocol:    Patient identity confirmed:  Verbally with patient and arm band Procedure details:    Location:  L ear and R ear   Procedure type: irrigation     Procedure outcomes: cerumen removed   Post-procedure details:    Inspection:  Ear canal clear, no bleeding and TM intact   Hearing quality:  Improved   Procedure completion:  Tolerated well, no immediate complications  (including critical care time)  Medications Ordered in UC Medications - No data to display  Initial Impression / Assessment and Plan / UC Course  I have reviewed the triage vital signs and the nursing notes.  Pertinent labs & imaging results that were available during my care of the patient were reviewed by me and considered in my  medical decision making (see chart for details).    Patient presents for evaluation and management of cerumen impaction after being advised  by her audiologist that both ears were full of wax during a hearing aid adjustment. She denies ear pain, hearing changes, dizziness, tinnitus, or headache. Examination confirmed cerumen impaction. Cerumen was successfully removed in clinic with resolution of obstruction. No evidence of otitis externa or media was observed. Patient was educated on safe ear hygiene practices and advised to avoid use of cotton swabs or other objects in the ears. She was instructed to return if she develops ear pain, hearing loss, drainage, fever, or dizziness, or if she experiences recurrent cerumen buildup requiring further removal.  Today's evaluation has revealed no signs of a dangerous process. Discussed diagnosis with patient and/or guardian. Patient and/or guardian aware of their diagnosis, possible red flag symptoms to watch out for and need for close follow up. Patient and/or guardian understands verbal and written discharge instructions. Patient and/or guardian comfortable with plan and disposition.  Patient and/or guardian has a clear mental status at this time, good insight into illness (after discussion and teaching) and has clear judgment to make decisions regarding their care  Documentation was completed with the aid of voice recognition software. Transcription may contain typographical errors. Final Clinical Impressions(s) / UC Diagnoses   Final diagnoses:  Bilateral impacted cerumen     Discharge Instructions      You were seen today earwax buildup. Earwax actually helps protect your ears by keeping bacteria away and preventing damage to the skin inside your ears. However, when too much earwax accumulates, it can cause discomfort and make it harder to hear.You had your ear irrigated today, which has helped improve your symptoms. Be sure to use the ear drops that  were prescribed to you. It's important not to put anything else in your ear, including Q-tips or cotton balls, as this can push the wax deeper or cause irritation. Also, try to keep water out of your ear, especially when showering or swimming. If you have any further concerns or if your symptoms persist, please follow up with your primary care provider.     ED Prescriptions   None    PDMP not reviewed this encounter.   Iola Lukes, [place] 12/18/23 2143

## 2023-12-18 NOTE — ED Triage Notes (Signed)
 Pt went to have hearing aids adjusted today and was told both ears were full of wax   No complaints from pt

## 2023-12-18 NOTE — Discharge Instructions (Addendum)
 You were seen today earwax buildup. Earwax actually helps protect your ears by keeping bacteria away and preventing damage to the skin inside your ears. However, when too much earwax accumulates, it can cause discomfort and make it harder to hear.You had your ear irrigated today, which has helped improve your symptoms. Be sure to use the ear drops that were prescribed to you. It's important not to put anything else in your ear, including Q-tips or cotton balls, as this can push the wax deeper or cause irritation. Also, try to keep water out of your ear, especially when showering or swimming. If you have any further concerns or if your symptoms persist, please follow up with your primary care provider.
